# Patient Record
Sex: Female | Born: 1975 | Race: White | Hispanic: No | Marital: Married
Health system: Southern US, Community
[De-identification: ages and names within clinical notes are randomized; demographics above are authoritative.]

---

## 2012-10-15 LAB — URINALYSIS, COMPLETE
Bilirubin,UR: NEGATIVE
Leukocyte Esterase: NEGATIVE
Nitrite: NEGATIVE
Ph: 5 (ref 4.5–8.0)
Protein: 30
RBC,UR: 2 /HPF (ref 0–5)
Specific Gravity: 1.04 (ref 1.003–1.030)
Squamous Epithelial: 4

## 2012-10-15 LAB — CBC
HCT: 41.8 % (ref 35.0–47.0)
HGB: 14.4 g/dL (ref 12.0–16.0)
MCV: 87 fL (ref 80–100)
Platelet: 289 10*3/uL (ref 150–440)
RDW: 13 % (ref 11.5–14.5)
WBC: 13.6 10*3/uL — ABNORMAL HIGH (ref 3.6–11.0)

## 2012-10-15 LAB — BASIC METABOLIC PANEL
BUN: 13 mg/dL (ref 7–18)
Creatinine: 0.87 mg/dL (ref 0.60–1.30)
EGFR (African American): >60
EGFR (Non-African Amer.): >60
Glucose: 122 mg/dL — ABNORMAL HIGH (ref 65–99)
Potassium: 3.8 mmol/L (ref 3.5–5.1)

## 2012-10-16 ENCOUNTER — Observation Stay: Payer: Self-pay | Admitting: Surgery

## 2012-10-16 LAB — LIPASE, BLOOD: Lipase: 71 U/L — ABNORMAL LOW (ref 73–393)

## 2012-10-16 LAB — HEPATIC FUNCTION PANEL A (ARMC)
Albumin: 3.7 g/dL (ref 3.4–5.0)
Alkaline Phosphatase: 77 U/L (ref 50–136)
SGPT (ALT): 51 U/L (ref 12–78)
Total Protein: 7.4 g/dL (ref 6.4–8.2)

## 2012-10-16 LAB — PREGNANCY, URINE: Pregnancy Test, Urine: NEGATIVE m[IU]/mL

## 2012-10-17 LAB — CBC WITH DIFFERENTIAL/PLATELET
Basophil %: 1.1 %
Eosinophil #: 0.2 10*3/uL (ref 0.0–0.7)
Eosinophil %: 2.8 %
HCT: 37.2 % (ref 35.0–47.0)
HGB: 12.7 g/dL (ref 12.0–16.0)
Lymphocyte #: 3.1 10*3/uL (ref 1.0–3.6)
Lymphocyte %: 38.5 %
MCHC: 34.1 g/dL (ref 32.0–36.0)
Monocyte #: 0.4 x10 3/mm (ref 0.2–0.9)
Neutrophil #: 4.3 10*3/uL (ref 1.4–6.5)
Neutrophil %: 52.7 %
Platelet: 247 10*3/uL (ref 150–440)
RBC: 4.21 10*6/uL (ref 3.80–5.20)
RDW: 13 % (ref 11.5–14.5)
WBC: 8.1 10*3/uL (ref 3.6–11.0)

## 2012-10-17 LAB — BASIC METABOLIC PANEL
BUN: 6 mg/dL — ABNORMAL LOW (ref 7–18)
Co2: 26 mmol/L (ref 21–32)
Creatinine: 0.99 mg/dL (ref 0.60–1.30)
Glucose: 96 mg/dL (ref 65–99)
Osmolality: 273 (ref 275–301)
Potassium: 3.6 mmol/L (ref 3.5–5.1)

## 2012-10-17 LAB — HEPATIC FUNCTION PANEL A (ARMC)
Alkaline Phosphatase: 64 U/L (ref 50–136)
Bilirubin, Direct: 0.2 mg/dL (ref 0.00–0.20)
SGOT(AST): 22 U/L (ref 15–37)
Total Protein: 6.1 g/dL — ABNORMAL LOW (ref 6.4–8.2)

## 2012-10-18 LAB — CBC WITH DIFFERENTIAL/PLATELET
Basophil #: 0.1 10*3/uL (ref 0.0–0.1)
Basophil %: 1.1 %
Eosinophil #: 0.2 10*3/uL (ref 0.0–0.7)
Eosinophil %: 2.8 %
HGB: 12.9 g/dL (ref 12.0–16.0)
Lymphocyte %: 30.3 %
MCHC: 34.4 g/dL (ref 32.0–36.0)
MCV: 87 fL (ref 80–100)
Monocyte %: 5.3 %
Neutrophil #: 3.9 10*3/uL (ref 1.4–6.5)
RBC: 4.31 10*6/uL (ref 3.80–5.20)

## 2012-10-18 LAB — COMPREHENSIVE METABOLIC PANEL
Albumin: 3.2 g/dL — ABNORMAL LOW (ref 3.4–5.0)
Alkaline Phosphatase: 78 U/L (ref 50–136)
Anion Gap: 4 — ABNORMAL LOW (ref 7–16)
BUN: 4 mg/dL — ABNORMAL LOW (ref 7–18)
Calcium, Total: 8.4 mg/dL — ABNORMAL LOW (ref 8.5–10.1)
Chloride: 106 mmol/L (ref 98–107)
Co2: 28 mmol/L (ref 21–32)
Creatinine: 1 mg/dL (ref 0.60–1.30)
EGFR (Non-African Amer.): >60
Potassium: 4.1 mmol/L (ref 3.5–5.1)
SGOT(AST): 91 U/L — ABNORMAL HIGH (ref 15–37)
SGPT (ALT): 82 U/L — ABNORMAL HIGH (ref 12–78)
Sodium: 138 mmol/L (ref 136–145)

## 2012-10-20 LAB — PATHOLOGY REPORT

## 2014-05-04 NOTE — Consult Note (Signed)
Brief Consult Note: Diagnosis: chronic N/V, consider EGD for PUD/GERD.   Patient was seen by consultant.   Consult note dictated.   Comments: [name] is a pleasant 39 y/o caucasian female with chronic postprandial nausea & vomiting & hx reflux esophagitis (EGD 2007).  More recently, 1 week ago she developed mid-back pain that radiates to RUQ/epigastric area with vomiting.  She reports using approx 8 excedrin migraines daily for headaches, indigestion & heartburn.  HIDA scan pending to complete gallbladder work-up.  If benign, EGD to look for PUD, gastritis, or esophagitis.  Cannot r/o gastroparesis or intermittent gastric outlet obstruction at the point either.  Incidental Right ovarian cyst on imaging.  Plan: 1) FU HIDA 2) Will need BID PPI upon discharge 3) EGD with Dr Servando SnareWohl as next step 4) Pt instructed to limit excedrin use  Thanks for consult.  Please see full dictated note [phone].  Electronic Signatures: Sherri Church, Sherri Church (NP)  (Signed 06-Oct-14 14:29)  Authored: Brief Consult Note   Last Updated: 06-Oct-14 14:29 by Sherri Church, Sherri Church (NP)

## 2014-05-04 NOTE — Consult Note (Signed)
PATIENT NAME:  Sherri Church, Sherri Church MR#:  [id number] DATE OF BIRTH:  [birth year]  GASTROENTEROLOGY CONSULTATION REPORT  DATE OF CONSULTATION:  10/17/2012  PRIMARY CARE PHYSICIAN: Not applicable.  GASTROENTEROLOGIST: Dr. Midge Minium.   SURGEON: Dr. Michela Pitcher.   REASON FOR CONSULTATION: Consider EGD for peptic ulcer disease/GERD.   HISTORY OF PRESENT ILLNESS: Sherri Church is a 39 year old Caucasian female who describes a week-long history of right mid-back pain which seems to radiate to her upper abdomen and right upper quadrant. She has had chronic nausea and vomiting for many years. She describes the vomiting at usually 30 to 45 minutes after eating.   Two nights ago she was at a dinner party. She began to have back pain and then had 2 to  3 episodes of vomiting. She usually has episodes several times per week of vomiting. She does have daily heartburn and indigestion. She was on Nexium years ago, but has not been on a PPI daily for quite some time.   She did have an ultrasound which showed gallbladder sludge, mild gallbladder distention, a positive Murphy's sign, a common bile duct at 4.5 mm, and an enlarged fatty liver. She had an EGD in [place] and says she had reflux esophagitis. She gives a  remote history of peptic ulcer  disease in 2002. She was placed on a IV Protonix 40 mg b.i.d. She had a CT scan of the abdomen and pelvis with IV and oral contrast which showed distended gallbladder, sludge, fatty liver, and a right ovarian cyst which needs ultrasound followup, measuring 3.6 x 2.4 cm. Her white blood cell count was 13.6. She was started on IV Zosyn. She does take Excedrin Migraine 3 to 4 b.i.d. most days of the week for migraine headaches.   PAST MEDICAL AND SURGICAL HISTORY: Migraines, peptic ulcer disease, GERD, as  noted above; anxiety, tonsillectomy, wisdom teeth extraction, tubal ligation.   MEDICATIONS PRIOR TO ADMISSION: Excedrin Migraine, 4 to 8 per day.   ALLERGIES: CODEINE, CAUSES CHANGES IN  MENTAL STATUS, AND SULFA CAUSED LUPUS-TYPE SYMPTOMS.   FAMILY HISTORY: There is no known family history of colon carcinoma, liver or chronic GI problems. Mother had uterine cancer. She is unsure of her father's history. She has no contact with him.   SOCIAL HISTORY: She has been unemployed for the last year. She was previously employed with IT. She is single. She has 3 healthy children.   REVIEW OF SYSTEMS: See HPI;  otherwise negative 10-point review of systems.   PHYSICAL EXAMINATION: VITAL SIGNS: Temperature 97.9, pulse 72, respirations 18, blood pressure 94/64, O2  saturation 92% on room air.  GENERAL: She is a well-developed, well-nourished Caucasian female in no acute distress.  HEENT: Sclerae clear, anicteric. Conjunctivae pink. Oropharynx pink and moist, without any lesions.  NECK: Supple, without any mass or thyromegaly.  CHEST: Heart regular rate and rhythm. Normal S1, S2. No murmurs, rubs or gallops.  LUNGS: Clear to auscultation bilaterally.  ABDOMEN: Protuberant. Positive bowel sounds x 4. No bruits auscultated. Abdomen is soft, nondistended. She has mild epigastric tenderness on deep palpation. There is no rebound tenderness or guarding. No hepatosplenomegaly or mass. Exam is limited given the patient's body habitus.  EXTREMITIES: Without clubbing or edema.  SKIN: Pink, warm and dry, without any rash or jaundice.  NEUROLOGIC: Grossly intact.  MUSCULOSKELETAL: Good equal movement and strength bilaterally.   LABORATORY STUDIES: Calcium is 7.9, BUN 6, otherwise normal basic metabolic panel. Total protein 6.1, albumin 2.8, otherwise normal LFTs. CBC is normal.  Urine pregnancy negative. Urinalysis positive for protein, white blood cells, epithelial cells and trace bacteria.   IMPRESSION: Sherri Church is a 39 year old Caucasian female with chronic postprandial nausea and vomiting and a history of reflux esophagitis based on EGD in 2007 done in [place]. More recently, 1 week ago she  developed mid-back pain that radiates to her right upper quadrant and epigastric area, with vomiting. She reports using approximately 8 Excedrin Migraines daily for headaches, indigestion and heartburn daily.   Her HIDA scan was pending at the time of consult, however it came back normal.   EGD planned with Dr. Servando SnareWohl to look for peptic ulcer disease, gastritis, or esophagitis. Cannot rule out gastroparesis or intermittent gastric outlet obstruction at this point, either. An incidental right ovarian cyst was noted on imaging.   PLAN: 1. EGD with Dr. Servando SnareWohl: I discussed risks and benefits to include, but not limited to bleeding, infection, perforation, drug reaction. She agrees with the plan and consent will be obtained.  2.  Will need PPI at least daily upon discharge.  3.  She was instructed to limit her Excedrin use.   Thank you for allowing us to participate in the care of Sherri Church.     ____________________________ Joselyn ArrowKandice L. Ethelene Closser, NP klj:dm D: 10/17/2012 14:37:12 ET T: 10/17/2012 15:05:07 ET JOB#: [id number]  cc: Joselyn ArrowKandice L. Keaundre Thelin, NP, <Dictator> Joselyn ArrowKANDICE L Khalise Billard FNP ELECTRONICALLY SIGNED 11/02/2012 10:28

## 2014-05-04 NOTE — Discharge Summary (Signed)
PATIENT NAME:  Sherri Church, Sherri Church MR#:  [id number] DATE OF BIRTH:  1975-05-12  DATE OF ADMISSION:  10/16/2012 DATE OF DISCHARGE:  10/20/2012  FINAL DIAGNOSIS: Abdominal pain and cholelithiasis.   PRINCIPAL PROCEDURES:  1.  CT scan abdomen and pelvis.  2.  Ultrasound right upper quadrant.  3.  HIDA scan.  4.  GI medicine consultation with EGD.   HOSPITAL COURSE SUMMARY: The patient was admitted with abdominal pain, GI medicine did see the patient. EGD was performed. HIDA scan was performed demonstrating no evidence of cystic duct obstruction. The patient continued to have right upper quadrant abdominal pain, especially with eating, did have a history of this prior to this and was taken to the Operating Room for a cholecystectomy with intraoperative cholangiography which was normal. This was done on the eighth. Postoperatively, the patient did well and was discharged home on postoperative day #1 with followup with me in the office.   DISCHARGE MEDICATIONS: Can be found on the reconciliation form.     ____________________________ Redge GainerMark A. Egbert GaribaldiBird, MD mab:cs D: 11/01/2012 19:50:59 ET T: 11/01/2012 20:28:53 ET JOB#: [id number]  cc: Loraine LericheMark A. Egbert GaribaldiBird, MD, <Dictator> Midge Miniumarren Wohl, MD Keaira Whitehurst Kela MillinA Amrita Radu MD ELECTRONICALLY SIGNED 11/01/2012 22:47
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
## 2014-05-04 NOTE — Consult Note (Signed)
Chief Complaint:  Subjective/Chief Complaint Pt continues to have nausea.  Her main concern is occipital headache, dizziness, mid-back pain.  Pain feels like "chronic migraines".   VITAL SIGNS/ANCILLARY NOTES: **Vital Signs.:   07-Oct-14 10:00  Vital Signs Type Q 4hr  Temperature Temperature (F) 98.1  Celsius 36.7  Temperature Source oral  Pulse Pulse 76  Respirations Respirations 17  Systolic BP Systolic BP 115  Diastolic BP (mmHg) Diastolic BP (mmHg) 72  Mean BP 86  Pulse Ox % Pulse Ox % 97  Pulse Ox Activity Level  At rest  Oxygen Delivery Room Air/ 21 %   Brief Assessment:  GEN well developed, well nourished, no acute distress, A/Ox3   Respiratory normal resp effort   Gastrointestinal Normal   Gastrointestinal details normal Soft  Nontender  Nondistended  Bowel sounds normal  No rebound tenderness  No gaurding   EXTR negative cyanosis/clubbing, negative edema   Additional Physical Exam Skin: pink, warm, dry   Lab Results: Routine Hem:  07-Oct-14 12:54   WBC (CBC) 6.4  RBC (CBC) 4.31  Hemoglobin (CBC) 12.9  Hematocrit (CBC) 37.6  Platelet Count (CBC) 238  MCV 87  MCH 30.0  MCHC 34.4  RDW 12.8  Neutrophil % 60.5  Lymphocyte % 30.3  Monocyte % 5.3  Eosinophil % 2.8  Basophil % 1.1  Neutrophil # 3.9  Lymphocyte # 1.9  Monocyte # 0.3  Eosinophil # 0.2  Basophil # 0.1 (Result(s) reported on 18 Oct 2012 at 01:06PM.)   Assessment/Plan:  Assessment/Plan:  Assessment PUD:  Small clean-based gastric ulcer.  This does not explain all of patient's symptoms.  It is concerning that she continues to have daily occipital headaches & back pain.  I don't know that this can all be explained by biliary etiology either.  Dr Servando SnareWohl has discussed with Dr Egbert GaribaldiBird. Chronic N/V: Improved.  PUD may explain some, but cannot r/o gastroparesis v. pain reaction to back pain/headaches v. central etiology.   Plan 1) BID PPI for at least 3 months 2) Limit NSAIDS 3) Further work-up of  headaches & back pain per attending 4) Consider GES as outpatient if N/V persists Call if any questions or concerns.  Pt care has been discussed with Dr Midge Miniumarren Wohl. Thanks   Electronic Signatures: Joselyn ArrowJones, Zaiah Credeur L (NP)  (Signed 07-Oct-14 13:40)  Authored: Chief Complaint, VITAL SIGNS/ANCILLARY NOTES, Brief Assessment, Lab Results, Assessment/Plan   Last Updated: 07-Oct-14 13:40 by Joselyn ArrowJones, Harbour Nordmeyer L (NP)

## 2014-05-04 NOTE — Op Note (Signed)
PATIENT NAME:  Sherri Church, Sherri Church MR#:  [id number] DATE OF BIRTH:  Nov 18, 1975  DATE OF PROCEDURE:  10/19/2012  PREOPERATIVE DIAGNOSES: Biliary colic, and cholelithiasis.   POSTOPERATIVE DIAGNOSES: Biliary colic, and cholelithiasis.   PROCEDURE PERFORMED: Laparoscopic cholecystectomy with intraoperative cholangiography.   SURGEON: Raynald KempMark A Johna Kearl, MD FACS   ASSISTANTS: None.   ANESTHESIA: General endotracheal.   FINDINGS: Normal cholangiogram; simple left adnexal cyst.   DRAINS: None.   Lap and needle counts correct x 2.   DESCRIPTION OF PROCEDURE: With informed consent, supine position, general endotracheal anesthesia. Time-out was observed. Sterile prep and drape was achieved. A 12 mm bladeless trocar was placed through the infraumbilical position through an open technique, with stay sutures being passed through the fascia. Pneumoperitoneum was established. The patient was then positioned in reverse Trendelenburg and airplane, right side-up. An 11 mm bladeless trocar was placed in the epigastric region, two 5-mm first assistant ports in the right subcostal margin. The gallbladder was grasped along its fundus and elevated towards the right shoulder. Lateral traction was achieved on Hartmann's pouch. The hepatoduodenal ligament was then dissected. Critical view of safety view was achieved.  Kumar type cholangiogram catheter was then placed across base of gallbladder. Cholangiography was performed utilizing the needle-tipped catheter. This demonstrated prompt filling of the bile ducts and duodenum, with no evidence of obstruction. The cystic duct anatomy was confirmed. The cystic duct was then doubly-clipped on the portal side, singly clipped on the gallbladder side, and divided. Several lymphatics were divided with sharp dissection and point cautery. A singly-branching cystic duct was identified with a critical view of safety, doubly ligated on the portal side with clips, singly-ligated on the  gallbladder side, and divided. One intervening lymphatic branch was divided between single Hemoclips. The gallbladder was then retrieved off the gallbladder fossa utilizing hook cautery apparatus and placed into an EndoCatch device and retrieved. The right upper quadrant was irrigated then with approximately 1 liter of normal saline, aspirated dry, and point hemostasis was obtained in the gallbladder fossa with electrocautery. The right upper quadrant appeared hemostatic. No evidence of bile drainage.   During  extraction of the Endo Catch device. We looked down into the pelvis, demonstrating no evidence of injury from the trocar site insertion. The patient was then positioned head-down. The right adnexa was normal. Clip was visualized. Left adnexa demonstrated a BTL clip as well as a soft, simple-appearing adnexal cyst.   Ports were then removed under direct visualization, the infraumbilical fascial defect being reapproximated with a figure-of-eight #0 Vicryl suture in vertical orientation and the existing stay sutures tied to each other. A total of 30 mL of 0.25% plain Marcaine was infiltrated along all skin and fascial incisions prior to closure; 4-0 Vicryl subcuticular was applied in subcuticular fashion. Benzoin, Steri-Strips, Telfa and Tegaderm were then applied. The patient was then subsequently extubated and taken to the recovery room in stable and satisfactory condition by anesthesia services.      ____________________________ Redge GainerMark A. Egbert GaribaldiBird, MD mab:dm D: 10/19/2012 11:50:35 ET T: 10/19/2012 12:02:18 ET JOB#: [id number]  cc: Loraine LericheMark A. Egbert GaribaldiBird, MD, <Dictator> Raynald KempMARK A Council Munguia MD ELECTRONICALLY SIGNED 10/23/2012 16:53

## 2014-05-29 IMAGING — NM NUCLEAR MEDICINE HEPATOHBILIARY INCLUDE GB
1 series · 13 of 13 positions shown · non-contrast
Comparison: none

REASON FOR EXAM: RUQ pain
COMMENTS:

[Series 1000: gallbladder statics · 4.80mm/px · 13 of 13 slices shown]
[im 1/13]
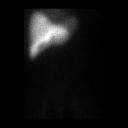
[im 2/13]
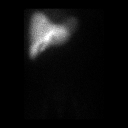
[im 3/13]
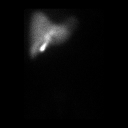
[im 4/13]
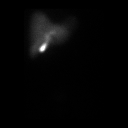
[im 5/13]
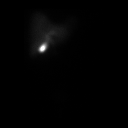
[im 6/13]
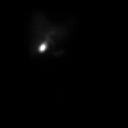
[im 7/13]
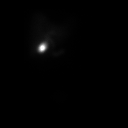
[im 8/13]
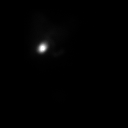
[im 9/13]
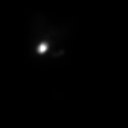
[im 10/13]
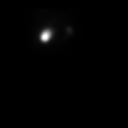
[im 11/13]
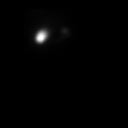
[im 12/13]
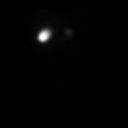
[im 13/13]
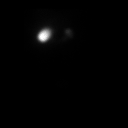

[13 of 13 positions shown; findings below may reference images not displayed]

PROCEDURE:     NM  - NM HEPATOBILIARY IMAGE  - October 17, 2012 [DATE]

RESULT:     The patient received a dose of 8.44 mCi of technetium 99m
Choletec. There is prompt extraction of tracer from the blood pool by the
liver. Gallbladder activity is first seen at 5 minutes after injection.
Common bile duct and small bowel localization are seen at 30 to 40 minutes
after administration. There is continued clearing of activity from the liver
parenchyma with increasing bowel and gallbladder activity present throughout
the study.
IMPRESSION: 1. There is no evidence of common bile duct or cystic duct obstruction area
the findings are not consistent with acute cholecystitis. Continued clinical
and laboratory correlation and followup is recommended.

[REDACTED]

## 2014-05-31 IMAGING — CR DG CHOLANGIOGRAM OPERATIVE
1 series · 1 of 1 positions shown · non-contrast
Comparison: none

REASON FOR EXAM: Cholelithiasis
COMMENTS:

[[id]]
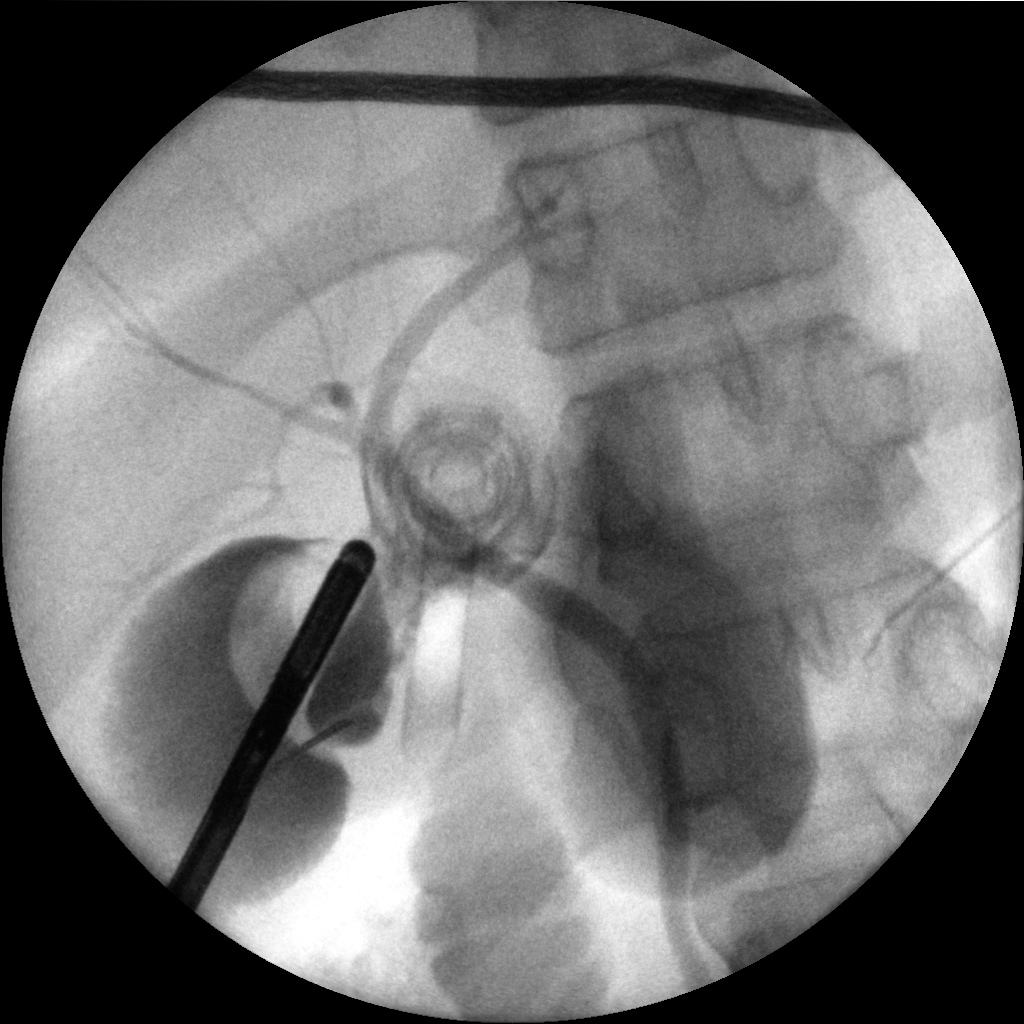

[1 of 1 positions shown; findings below may reference images not displayed]

PROCEDURE:     DXR - DXR CHOLANGIOGRAM OP (INITIAL)  - October 19, 2012 [DATE]

RESULT:     Findings: 1 intraoperative fluoroscopic spot images are obtained
of an intraoperative cholangiogram. by Dr. Sangbeom without a radiologist
present. Total fluoroscopy time 28 seconds.

The distal portion of the common bile duct is excluded from the
field-of-view and not evaluated. There is opacification of the common bile
duct without a filling defect. There is contrast seen within the duodenum.
IMPRESSION: Please see above.

[REDACTED]

## 2015-03-14 IMAGING — US ABDOMEN ULTRASOUND LIMITED
1 series · 14 of 25 positions shown · non-contrast
Comparison: none

REASON FOR EXAM: vomiting - pain RUQ
COMMENTS:   Body Site: GB and Fossa, CBD, Head of Pancreas

[Series 1: abdomen ultrasound limited · 0.30mm/px · 14 of 25 slices shown]
[im 1/25]
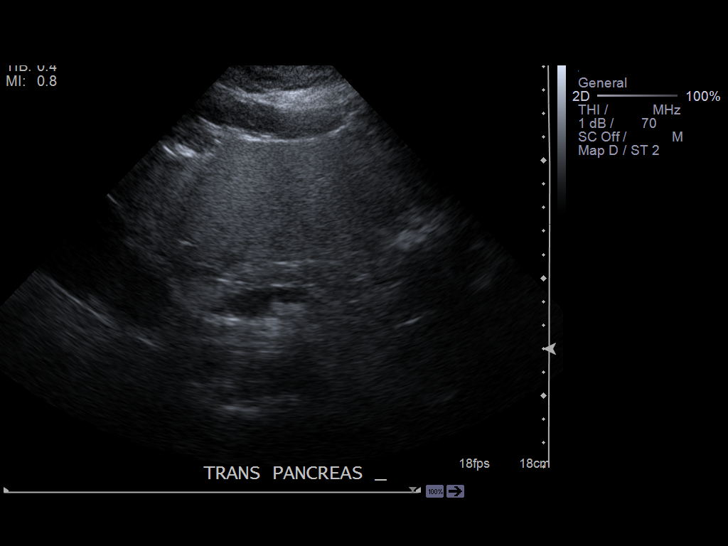
[im 3/25]
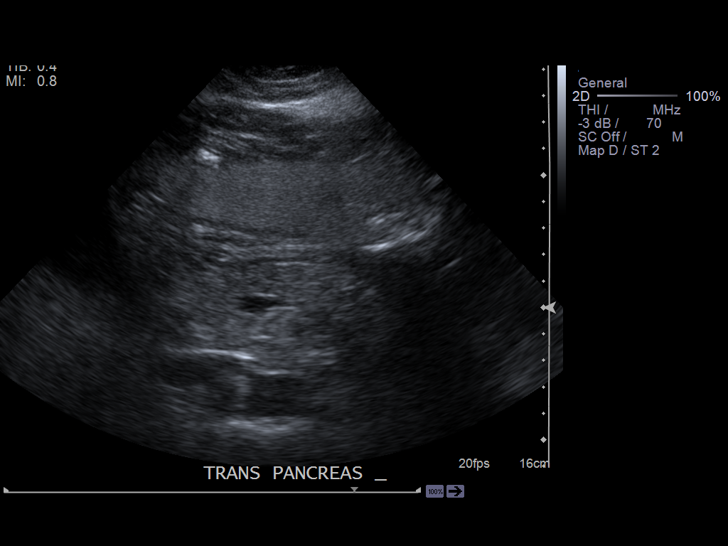
[im 5/25]
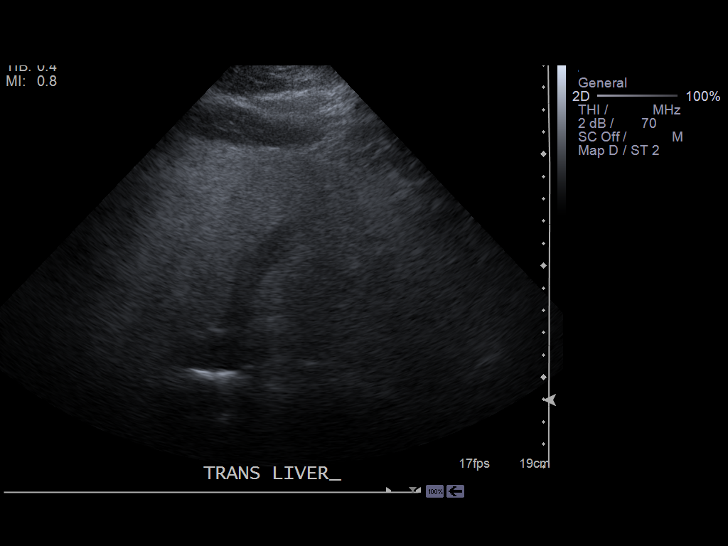
[im 7/25]
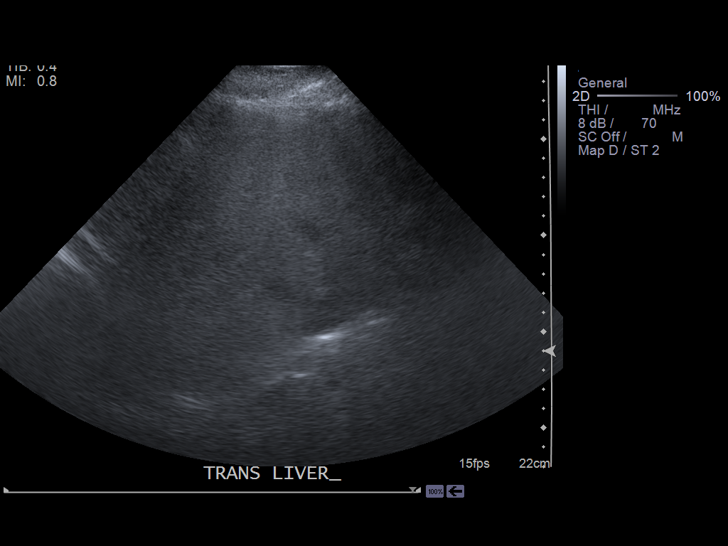
[im 9/25]
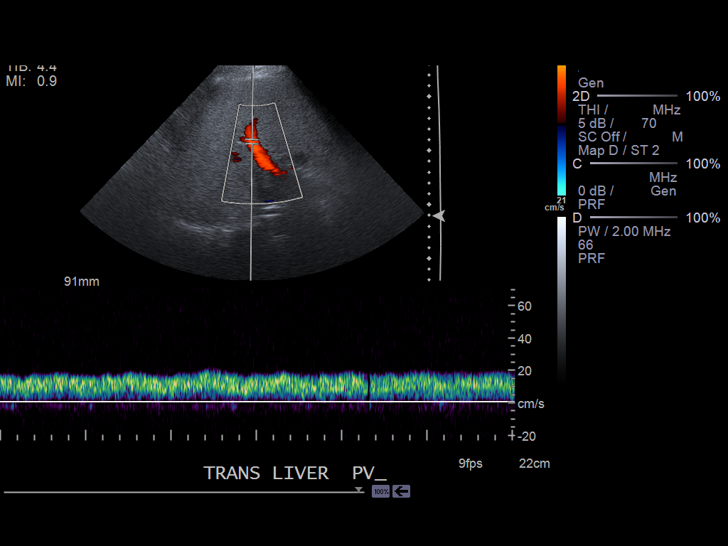
[im 10/25]
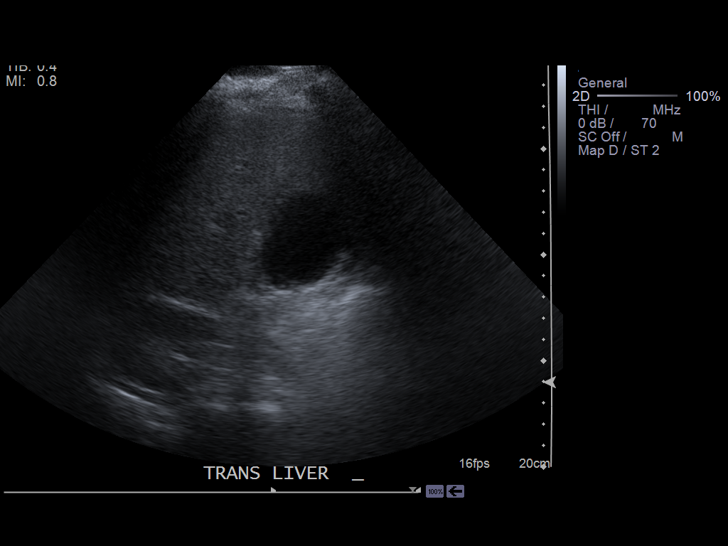
[im 12/25]
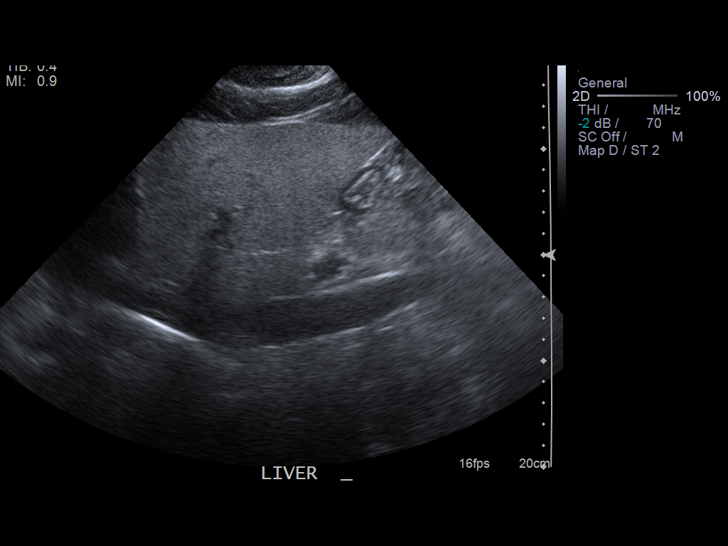
[im 14/25]
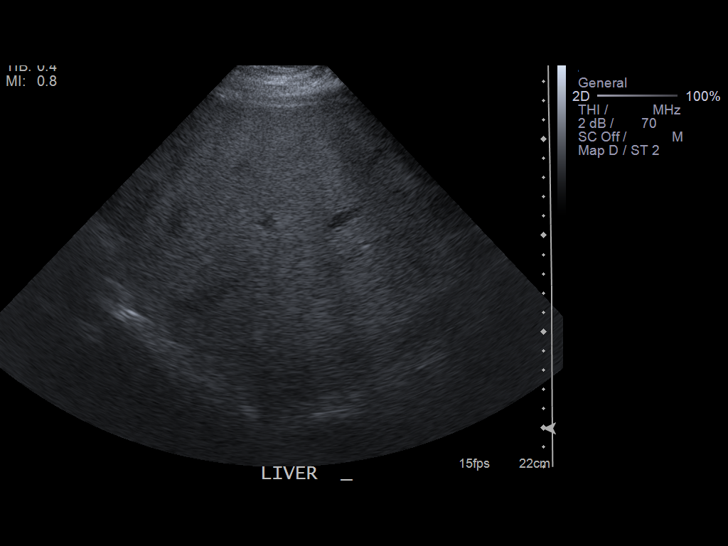
[im 16/25]
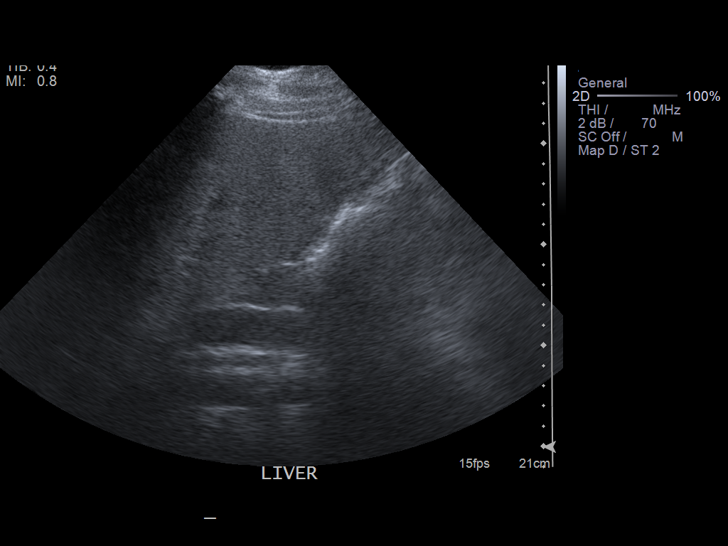
[im 17/25]
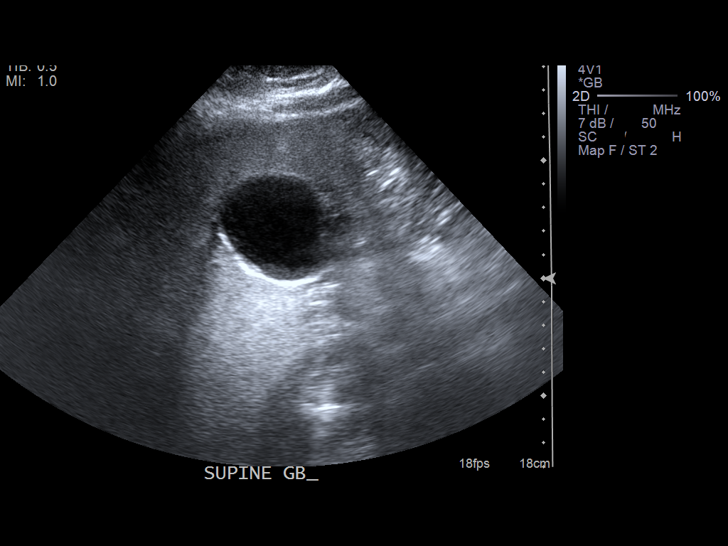
[im 19/25]
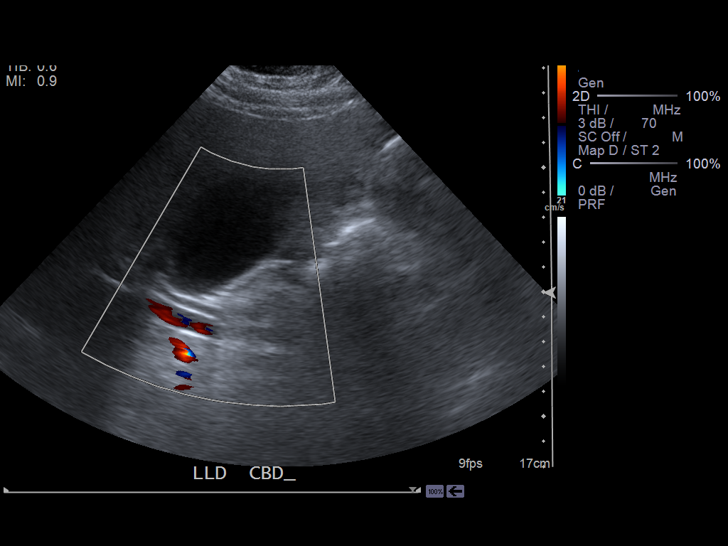
[im 21/25]
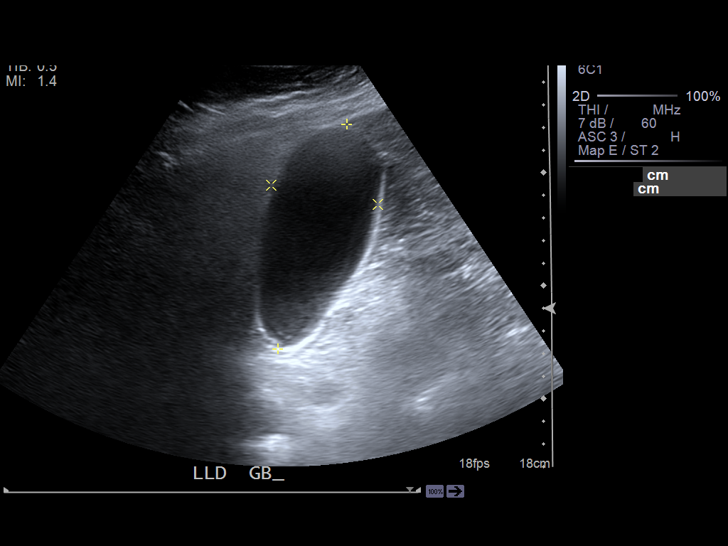
[im 23/25]
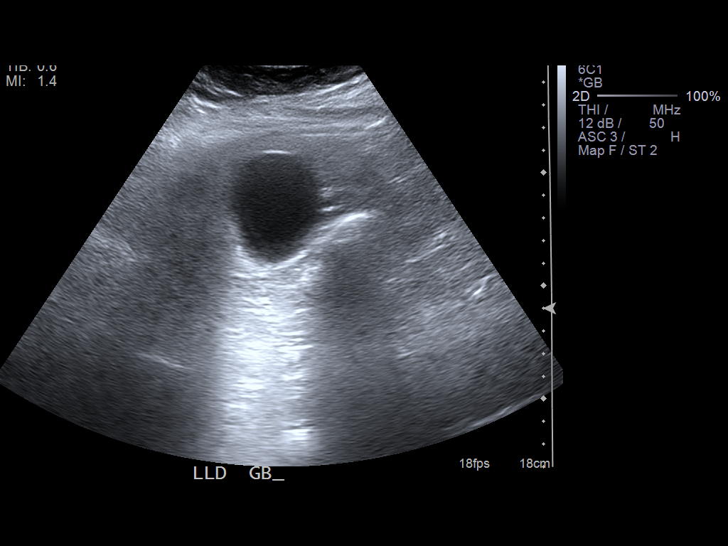
[im 25/25]
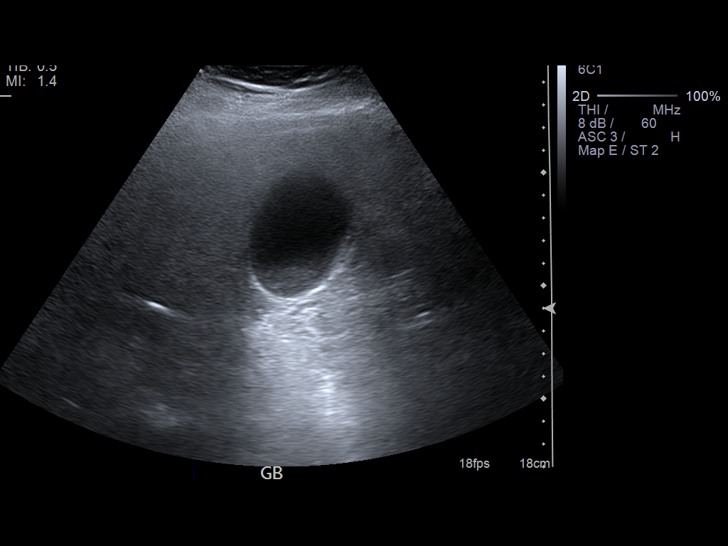

[14 of 25 positions shown; findings below may reference images not displayed]

PROCEDURE:     US  - US ABDOMEN LIMITED SURVEY  - October 16, 2012  [DATE]

RESULT:     The liver demonstrates mild increase in size measuring 18.8 cm
in the midclavicular line. The echotexture is increased consistent with
fatty infiltration. There is no focal mass or ductal dilation. Portal venous
flow is normal in direction toward the liver.

The gallbladder is adequately distended and contains echogenic material
compatible with sludge. There is a positive sonographic Murphy's sign. There
is no gallbladder wall thickening or pericholecystic fluid. The common bile
duct is normal at 4.5 mm in diameter. Evaluation of the pancreas is limited
due to bowel gas.
IMPRESSION: 1. There is sludge present within the mildly distended gallbladder and there
is a positive sonographic Murphy's sign. These findings are consistent with
acute cholecystitis.
2. The observed portions of the liver and pancreas and common bile duct
exhibit no acute abnormalities. The liver is mildly enlarged and exhibits
fatty infiltrative change.

[REDACTED]
